# Patient Record
Sex: Female | Born: 1965
Health system: Southern US, Community
[De-identification: ages and names within clinical notes are randomized; demographics above are authoritative.]

## PROBLEM LIST (undated history)

## (undated) HISTORY — PX: APPENDECTOMY: SHX54

---

## 2002-05-06 ENCOUNTER — Other Ambulatory Visit: Admission: RE | Admit: 2002-05-06 | Discharge: 2002-05-06 | Payer: Self-pay | Admitting: Gynecology

## 2003-11-07 ENCOUNTER — Other Ambulatory Visit: Admission: RE | Admit: 2003-11-07 | Discharge: 2003-11-07 | Payer: Self-pay | Admitting: Gynecology

## 2005-04-15 ENCOUNTER — Other Ambulatory Visit: Admission: RE | Admit: 2005-04-15 | Discharge: 2005-04-15 | Payer: Self-pay | Admitting: Gynecology

## 2005-07-24 ENCOUNTER — Emergency Department (HOSPITAL_COMMUNITY): Admission: EM | Admit: 2005-07-24 | Discharge: 2005-07-24 | Payer: Self-pay | Admitting: Emergency Medicine

## 2006-02-22 ENCOUNTER — Inpatient Hospital Stay (HOSPITAL_COMMUNITY): Admission: AD | Admit: 2006-02-22 | Discharge: 2006-02-22 | Payer: Self-pay | Admitting: Gynecology

## 2006-04-24 ENCOUNTER — Inpatient Hospital Stay (HOSPITAL_COMMUNITY): Admission: AD | Admit: 2006-04-24 | Discharge: 2006-04-24 | Payer: Self-pay | Admitting: Gynecology

## 2006-05-08 ENCOUNTER — Inpatient Hospital Stay (HOSPITAL_COMMUNITY): Admission: AD | Admit: 2006-05-08 | Discharge: 2006-05-11 | Payer: Self-pay | Admitting: Gynecology

## 2006-05-09 ENCOUNTER — Encounter (INDEPENDENT_AMBULATORY_CARE_PROVIDER_SITE_OTHER): Payer: Self-pay | Admitting: Specialist

## 2006-06-13 ENCOUNTER — Other Ambulatory Visit: Admission: RE | Admit: 2006-06-13 | Discharge: 2006-06-13 | Payer: Self-pay | Admitting: Gynecology

## 2007-09-23 ENCOUNTER — Inpatient Hospital Stay (HOSPITAL_COMMUNITY): Admission: AD | Admit: 2007-09-23 | Discharge: 2007-09-26 | Payer: Self-pay | Admitting: Obstetrics & Gynecology

## 2007-09-23 ENCOUNTER — Inpatient Hospital Stay (HOSPITAL_COMMUNITY): Admission: AD | Admit: 2007-09-23 | Discharge: 2007-09-23 | Payer: Self-pay | Admitting: Obstetrics and Gynecology

## 2009-10-30 ENCOUNTER — Emergency Department (HOSPITAL_COMMUNITY): Admission: EM | Admit: 2009-10-30 | Discharge: 2009-10-30 | Payer: Self-pay | Admitting: Emergency Medicine

## 2010-12-09 LAB — CBC
HCT: 30.4 — ABNORMAL LOW
HCT: 37.3
MCHC: 34.5
MCV: 95.4
Platelets: 126 — ABNORMAL LOW
RBC: 3.21 — ABNORMAL LOW
RBC: 3.92
WBC: 16.5 — ABNORMAL HIGH

## 2010-12-09 LAB — RPR: RPR Ser Ql: NONREACTIVE

## 2011-09-18 ENCOUNTER — Emergency Department (HOSPITAL_COMMUNITY)
Admission: EM | Admit: 2011-09-18 | Discharge: 2011-09-18 | Disposition: A | Discharge status: Home / Self Care | Payer: BC Managed Care – PPO | Attending: Emergency Medicine | Admitting: Emergency Medicine

## 2011-09-18 ENCOUNTER — Emergency Department (HOSPITAL_COMMUNITY): Payer: BC Managed Care – PPO

## 2011-09-18 ENCOUNTER — Encounter (HOSPITAL_COMMUNITY): Payer: Self-pay | Admitting: Nurse Practitioner

## 2011-09-18 DIAGNOSIS — Y9355 Activity, bike riding: Secondary | ICD-10-CM | POA: Insufficient documentation

## 2011-09-18 DIAGNOSIS — IMO0002 Reserved for concepts with insufficient information to code with codable children: Secondary | ICD-10-CM | POA: Insufficient documentation

## 2011-09-18 DIAGNOSIS — M25529 Pain in unspecified elbow: Secondary | ICD-10-CM | POA: Insufficient documentation

## 2011-09-18 DIAGNOSIS — M25522 Pain in left elbow: Secondary | ICD-10-CM

## 2011-09-18 MED ORDER — OXYCODONE-ACETAMINOPHEN 5-325 MG PO TABS
1.0000 | ORAL_TABLET | Freq: Once | ORAL | Status: AC
  Administered 2011-09-18: 1 via ORAL
  Filled 2011-09-18: qty 1
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
## 2012-03-14 ENCOUNTER — Emergency Department (HOSPITAL_COMMUNITY)
Admission: EM | Admit: 2012-03-14 | Discharge: 2012-03-14 | Disposition: A | Discharge status: Home / Self Care | Payer: BC Managed Care – PPO | Attending: Emergency Medicine | Admitting: Emergency Medicine

## 2012-03-14 ENCOUNTER — Encounter (HOSPITAL_COMMUNITY): Payer: Self-pay | Admitting: *Deleted

## 2012-03-14 DIAGNOSIS — R112 Nausea with vomiting, unspecified: Secondary | ICD-10-CM

## 2012-03-14 DIAGNOSIS — Z3202 Encounter for pregnancy test, result negative: Secondary | ICD-10-CM | POA: Insufficient documentation

## 2012-03-14 LAB — COMPREHENSIVE METABOLIC PANEL
ALT: 13 U/L (ref 0–35)
AST: 25 U/L (ref 0–37)
Albumin: 3.9 g/dL (ref 3.5–5.2)
BUN: 16 mg/dL (ref 6–23)
CO2: 22 mEq/L (ref 19–32)
Chloride: 103 mEq/L (ref 96–112)
Glucose, Bld: 87 mg/dL (ref 70–99)
Potassium: 3.2 mEq/L — ABNORMAL LOW (ref 3.5–5.1)
Total Bilirubin: 0.3 mg/dL (ref 0.3–1.2)

## 2012-03-14 LAB — CBC WITH DIFFERENTIAL/PLATELET
Eosinophils Absolute: 0 10*3/uL (ref 0.0–0.7)
HCT: 39.8 % (ref 36.0–46.0)
Hemoglobin: 13.4 g/dL (ref 12.0–15.0)
Lymphs Abs: 0.6 10*3/uL — ABNORMAL LOW (ref 0.7–4.0)
MCH: 30 pg (ref 26.0–34.0)
MCHC: 33.7 g/dL (ref 30.0–36.0)
MCV: 89 fL (ref 78.0–100.0)
Monocytes Absolute: 0.8 10*3/uL (ref 0.1–1.0)
Monocytes Relative: 16 % — ABNORMAL HIGH (ref 3–12)
Neutrophils Relative %: 71 % (ref 43–77)
RBC: 4.47 MIL/uL (ref 3.87–5.11)

## 2012-03-14 LAB — POCT PREGNANCY, URINE: Preg Test, Ur: NEGATIVE

## 2012-03-14 LAB — URINALYSIS, ROUTINE W REFLEX MICROSCOPIC
Glucose, UA: NEGATIVE mg/dL
Nitrite: NEGATIVE
Specific Gravity, Urine: 1.036 — ABNORMAL HIGH (ref 1.005–1.030)
pH: 5.5 (ref 5.0–8.0)

## 2012-03-14 LAB — URINE MICROSCOPIC-ADD ON

## 2012-03-14 MED ORDER — ONDANSETRON 8 MG PO TBDP: 8.0000 mg | ORAL_TABLET | Freq: Three times a day (TID) | ORAL | Status: AC | PRN

## 2012-03-14 MED ORDER — SODIUM CHLORIDE 0.9 % IV BOLUS (SEPSIS)
1000.0000 mL | Freq: Once | INTRAVENOUS | Status: AC
  Administered 2012-03-14: 1000 mL via INTRAVENOUS

## 2012-03-14 MED ORDER — ONDANSETRON HCL 4 MG/2ML IJ SOLN
4.0000 mg | Freq: Once | INTRAMUSCULAR | Status: AC
  Administered 2012-03-14: 4 mg via INTRAVENOUS
  Filled 2012-03-14: qty 2

## 2012-03-14 NOTE — ED Notes (Signed)
Attempted to collect urine, pt could not void 

## 2012-03-14 NOTE — ED Provider Notes (Signed)
History     CSN: [id number]  Arrival date & time 03/14/12  1038   First MD Initiated Contact with Patient 03/14/12 1057      Chief Complaint  Patient presents with  . Nausea  . Emesis    (Consider location/radiation/quality/duration/timing/severity/associated sxs/prior treatment) HPI Comments: Patient with history of appendectomy as a teenager presents with two-day history of nausea and vomiting. Patient states that at first it was intermittent however vomiting has become more persistent and she is no longer able to keep down any solids or fluids. Last vomiting episode was this morning. Vomiting is nonbloody, nonbilious. Patient denies having any diarrhea. No significant abdominal pain. No fevers, cold symptoms, shortness of breath, chest pain, urinary symptoms. Last menstrual period was 03/05/2012. No treatments prior to arrival. Onset acute. Course is gradually worsening. Nothing makes symptoms better or worse. Patient denies heavy alcohol or NSAID use. No history of stomach ulcers.  Patient is a 47 y.o. female presenting with vomiting. The history is provided by the patient.  Emesis  Pertinent negatives include no abdominal pain, no cough, no diarrhea, no fever, no headaches and no myalgias.    History reviewed. No pertinent past medical history.  History reviewed. No pertinent past surgical history.  History reviewed. No pertinent family history.  History  Substance Use Topics  . Smoking status: Never Smoker   . Smokeless tobacco: Not on file  . Alcohol Use: Yes     Comment: rare    OB History    Grav Para Term Preterm Abortions TAB SAB Ect Mult Living                  Review of Systems  Constitutional: Negative for fever.  HENT: Negative for sore throat and rhinorrhea.   Eyes: Negative for redness.  Respiratory: Negative for cough.   Cardiovascular: Negative for chest pain.  Gastrointestinal: Positive for nausea and vomiting. Negative for abdominal pain and  diarrhea.  Genitourinary: Negative for dysuria.  Musculoskeletal: Negative for myalgias.  Skin: Negative for rash.  Neurological: Negative for headaches.    Allergies  Review of patient's allergies indicates no known allergies.  Home Medications  No current outpatient prescriptions on file.  BP 111/74  Pulse 94  Temp 97.2 F (36.2 C) (Oral)  Resp 20  SpO2 99%  LMP 03/05/2012  Physical Exam  Nursing note and vitals reviewed. Constitutional: She appears well-developed and well-nourished.  HENT:  Head: Normocephalic and atraumatic.  Eyes: Conjunctivae normal are normal. Right eye exhibits no discharge. Left eye exhibits no discharge.  Neck: Normal range of motion. Neck supple.  Cardiovascular: Normal rate, regular rhythm and normal heart sounds.   Pulmonary/Chest: Effort normal and breath sounds normal.  Abdominal: Soft. Bowel sounds are normal. She exhibits no distension. There is tenderness (mild upper abd tenderness on deep palpation). There is no rebound and no guarding.  Neurological: She is alert.  Skin: Skin is warm and dry.  Psychiatric: Her mood appears anxious.       Poor eye contact    ED Course  Procedures (including critical care time)  Labs Reviewed  CBC WITH DIFFERENTIAL - Abnormal; Notable for the following:    Lymphs Abs 0.6 (*)     Monocytes Relative 16 (*)     All other components within normal limits  COMPREHENSIVE METABOLIC PANEL - Abnormal; Notable for the following:    Potassium 3.2 (*)     All other components within normal limits  URINALYSIS, ROUTINE W REFLEX MICROSCOPIC -  Abnormal; Notable for the following:    Color, Urine AMBER (*)  BIOCHEMICALS MAY BE AFFECTED BY COLOR   APPearance CLOUDY (*)     Specific Gravity, Urine 1.036 (*)     Hgb urine dipstick MODERATE (*)     Bilirubin Urine MODERATE (*)     Ketones, ur 40 (*)     Protein, ur 30 (*)     Leukocytes, UA SMALL (*)     All other components within normal limits  URINE  MICROSCOPIC-ADD ON - Abnormal; Notable for the following:    Squamous Epithelial / LPF MANY (*)     All other components within normal limits  POCT PREGNANCY, URINE   No results found.   1. Nausea and vomiting     11:11 AM Patient seen and examined. Work-up initiated. Medications ordered.   Vital signs reviewed and are as follows: Filed Vitals:   03/14/12 1042  BP: 111/74  Pulse: 94  Temp: 97.2 F (36.2 C)  Resp: 20   Patient feeling much better after treatment with fluids and antiemetics. She is tolerating oral fluids without vomiting. Patient informed of lab results. She has received 2 L normal saline.  The patient was urged to return to the Emergency Department immediately with worsening of current symptoms, worsening abdominal pain, persistent vomiting, blood noted in stools, fever, or any other concerns. The patient verbalized understanding.   MDM  Patient with nausea and vomiting for 24 hours. No diarrhea. Minimal abdominal pain on exam or by history. Labs are reassuring. Urine is concentrated. Patient treated with IV fluids. Otherwise she appears well and is stable for discharge home with supportive care. Strict return instructions given.        [place], [place] 03/14/12 [phone]

## 2012-03-14 NOTE — ED Notes (Signed)
Nausea and vomiting since yesterday. No diarrhea. Nobody else in home ill.

## 2012-03-14 NOTE — ED Provider Notes (Signed)
Medical screening examination/treatment/procedure(s) were performed by non-physician practitioner and as supervising physician I was immediately available for consultation/collaboration.    Lyrick Worland L Honestii Marton, MD 03/14/12 2229 

## 2016-01-13 DIAGNOSIS — R5383 Other fatigue: Secondary | ICD-10-CM | POA: Diagnosis not present

## 2016-01-13 DIAGNOSIS — Z23 Encounter for immunization: Secondary | ICD-10-CM | POA: Diagnosis not present

## 2016-01-13 DIAGNOSIS — N951 Menopausal and female climacteric states: Secondary | ICD-10-CM | POA: Diagnosis not present

## 2016-01-13 DIAGNOSIS — R232 Flushing: Secondary | ICD-10-CM | POA: Diagnosis not present

## 2016-02-10 DIAGNOSIS — N951 Menopausal and female climacteric states: Secondary | ICD-10-CM | POA: Diagnosis not present

## 2016-02-10 DIAGNOSIS — F418 Other specified anxiety disorders: Secondary | ICD-10-CM | POA: Diagnosis not present

## 2016-08-02 DIAGNOSIS — F332 Major depressive disorder, recurrent severe without psychotic features: Secondary | ICD-10-CM | POA: Diagnosis not present

## 2016-08-02 DIAGNOSIS — F411 Generalized anxiety disorder: Secondary | ICD-10-CM | POA: Diagnosis not present

## 2016-08-10 DIAGNOSIS — F411 Generalized anxiety disorder: Secondary | ICD-10-CM | POA: Diagnosis not present

## 2016-08-10 DIAGNOSIS — F332 Major depressive disorder, recurrent severe without psychotic features: Secondary | ICD-10-CM | POA: Diagnosis not present

## 2016-08-16 DIAGNOSIS — F411 Generalized anxiety disorder: Secondary | ICD-10-CM | POA: Diagnosis not present

## 2016-08-16 DIAGNOSIS — F332 Major depressive disorder, recurrent severe without psychotic features: Secondary | ICD-10-CM | POA: Diagnosis not present

## 2016-10-18 DIAGNOSIS — F418 Other specified anxiety disorders: Secondary | ICD-10-CM | POA: Diagnosis not present

## 2016-12-09 DIAGNOSIS — F411 Generalized anxiety disorder: Secondary | ICD-10-CM | POA: Diagnosis not present

## 2016-12-09 DIAGNOSIS — F332 Major depressive disorder, recurrent severe without psychotic features: Secondary | ICD-10-CM | POA: Diagnosis not present

## 2016-12-15 DIAGNOSIS — F411 Generalized anxiety disorder: Secondary | ICD-10-CM | POA: Diagnosis not present

## 2016-12-15 DIAGNOSIS — F332 Major depressive disorder, recurrent severe without psychotic features: Secondary | ICD-10-CM | POA: Diagnosis not present

## 2017-02-14 DIAGNOSIS — F411 Generalized anxiety disorder: Secondary | ICD-10-CM | POA: Diagnosis not present

## 2017-02-15 DIAGNOSIS — Z23 Encounter for immunization: Secondary | ICD-10-CM | POA: Diagnosis not present

## 2017-02-15 DIAGNOSIS — F418 Other specified anxiety disorders: Secondary | ICD-10-CM | POA: Diagnosis not present

## 2017-06-20 DIAGNOSIS — F411 Generalized anxiety disorder: Secondary | ICD-10-CM | POA: Diagnosis not present

## 2017-06-20 DIAGNOSIS — F332 Major depressive disorder, recurrent severe without psychotic features: Secondary | ICD-10-CM | POA: Diagnosis not present

## 2017-06-27 DIAGNOSIS — F332 Major depressive disorder, recurrent severe without psychotic features: Secondary | ICD-10-CM | POA: Diagnosis not present

## 2017-06-27 DIAGNOSIS — F411 Generalized anxiety disorder: Secondary | ICD-10-CM | POA: Diagnosis not present

## 2017-07-11 DIAGNOSIS — F332 Major depressive disorder, recurrent severe without psychotic features: Secondary | ICD-10-CM | POA: Diagnosis not present

## 2017-07-11 DIAGNOSIS — F411 Generalized anxiety disorder: Secondary | ICD-10-CM | POA: Diagnosis not present

## 2017-07-18 DIAGNOSIS — F322 Major depressive disorder, single episode, severe without psychotic features: Secondary | ICD-10-CM | POA: Diagnosis not present

## 2017-07-18 DIAGNOSIS — F411 Generalized anxiety disorder: Secondary | ICD-10-CM | POA: Diagnosis not present

## 2017-07-24 DIAGNOSIS — F332 Major depressive disorder, recurrent severe without psychotic features: Secondary | ICD-10-CM | POA: Diagnosis not present

## 2017-07-24 DIAGNOSIS — F411 Generalized anxiety disorder: Secondary | ICD-10-CM | POA: Diagnosis not present

## 2017-07-31 DIAGNOSIS — F4323 Adjustment disorder with mixed anxiety and depressed mood: Secondary | ICD-10-CM | POA: Diagnosis not present

## 2017-08-09 DIAGNOSIS — F331 Major depressive disorder, recurrent, moderate: Secondary | ICD-10-CM | POA: Diagnosis not present

## 2017-08-16 DIAGNOSIS — F4320 Adjustment disorder, unspecified: Secondary | ICD-10-CM | POA: Diagnosis not present

## 2017-08-21 DIAGNOSIS — F331 Major depressive disorder, recurrent, moderate: Secondary | ICD-10-CM | POA: Diagnosis not present

## 2017-08-28 DIAGNOSIS — F331 Major depressive disorder, recurrent, moderate: Secondary | ICD-10-CM | POA: Diagnosis not present

## 2017-09-13 DIAGNOSIS — F331 Major depressive disorder, recurrent, moderate: Secondary | ICD-10-CM | POA: Diagnosis not present

## 2017-09-20 DIAGNOSIS — F331 Major depressive disorder, recurrent, moderate: Secondary | ICD-10-CM | POA: Diagnosis not present

## 2017-09-27 DIAGNOSIS — F331 Major depressive disorder, recurrent, moderate: Secondary | ICD-10-CM | POA: Diagnosis not present

## 2017-10-04 DIAGNOSIS — F331 Major depressive disorder, recurrent, moderate: Secondary | ICD-10-CM | POA: Diagnosis not present

## 2017-10-10 DIAGNOSIS — F331 Major depressive disorder, recurrent, moderate: Secondary | ICD-10-CM | POA: Diagnosis not present

## 2017-10-13 DIAGNOSIS — J029 Acute pharyngitis, unspecified: Secondary | ICD-10-CM | POA: Diagnosis not present

## 2017-10-13 DIAGNOSIS — J0100 Acute maxillary sinusitis, unspecified: Secondary | ICD-10-CM | POA: Diagnosis not present

## 2017-10-18 DIAGNOSIS — F331 Major depressive disorder, recurrent, moderate: Secondary | ICD-10-CM | POA: Diagnosis not present

## 2017-10-25 DIAGNOSIS — F332 Major depressive disorder, recurrent severe without psychotic features: Secondary | ICD-10-CM | POA: Diagnosis not present

## 2017-11-17 DIAGNOSIS — F4310 Post-traumatic stress disorder, unspecified: Secondary | ICD-10-CM | POA: Diagnosis not present

## 2017-11-23 DIAGNOSIS — F4310 Post-traumatic stress disorder, unspecified: Secondary | ICD-10-CM | POA: Diagnosis not present

## 2017-12-04 DIAGNOSIS — F332 Major depressive disorder, recurrent severe without psychotic features: Secondary | ICD-10-CM | POA: Diagnosis not present

## 2017-12-12 DIAGNOSIS — F332 Major depressive disorder, recurrent severe without psychotic features: Secondary | ICD-10-CM | POA: Diagnosis not present

## 2017-12-19 DIAGNOSIS — F332 Major depressive disorder, recurrent severe without psychotic features: Secondary | ICD-10-CM | POA: Diagnosis not present

## 2017-12-27 DIAGNOSIS — F332 Major depressive disorder, recurrent severe without psychotic features: Secondary | ICD-10-CM | POA: Diagnosis not present

## 2018-01-01 DIAGNOSIS — F332 Major depressive disorder, recurrent severe without psychotic features: Secondary | ICD-10-CM | POA: Diagnosis not present

## 2018-01-09 DIAGNOSIS — F332 Major depressive disorder, recurrent severe without psychotic features: Secondary | ICD-10-CM | POA: Diagnosis not present

## 2018-01-17 DIAGNOSIS — F332 Major depressive disorder, recurrent severe without psychotic features: Secondary | ICD-10-CM | POA: Diagnosis not present

## 2018-01-24 DIAGNOSIS — F332 Major depressive disorder, recurrent severe without psychotic features: Secondary | ICD-10-CM | POA: Diagnosis not present

## 2018-01-31 DIAGNOSIS — F332 Major depressive disorder, recurrent severe without psychotic features: Secondary | ICD-10-CM | POA: Diagnosis not present

## 2018-02-06 DIAGNOSIS — F332 Major depressive disorder, recurrent severe without psychotic features: Secondary | ICD-10-CM | POA: Diagnosis not present

## 2018-02-14 DIAGNOSIS — F332 Major depressive disorder, recurrent severe without psychotic features: Secondary | ICD-10-CM | POA: Diagnosis not present

## 2018-02-19 DIAGNOSIS — F332 Major depressive disorder, recurrent severe without psychotic features: Secondary | ICD-10-CM | POA: Diagnosis not present

## 2018-02-27 DIAGNOSIS — F332 Major depressive disorder, recurrent severe without psychotic features: Secondary | ICD-10-CM | POA: Diagnosis not present

## 2018-03-12 DIAGNOSIS — F332 Major depressive disorder, recurrent severe without psychotic features: Secondary | ICD-10-CM | POA: Diagnosis not present

## 2018-03-21 DIAGNOSIS — F332 Major depressive disorder, recurrent severe without psychotic features: Secondary | ICD-10-CM | POA: Diagnosis not present

## 2018-03-27 DIAGNOSIS — F332 Major depressive disorder, recurrent severe without psychotic features: Secondary | ICD-10-CM | POA: Diagnosis not present

## 2018-04-17 DIAGNOSIS — F332 Major depressive disorder, recurrent severe without psychotic features: Secondary | ICD-10-CM | POA: Diagnosis not present

## 2018-04-23 DIAGNOSIS — F332 Major depressive disorder, recurrent severe without psychotic features: Secondary | ICD-10-CM | POA: Diagnosis not present

## 2018-04-30 DIAGNOSIS — F332 Major depressive disorder, recurrent severe without psychotic features: Secondary | ICD-10-CM | POA: Diagnosis not present

## 2018-05-08 DIAGNOSIS — F332 Major depressive disorder, recurrent severe without psychotic features: Secondary | ICD-10-CM | POA: Diagnosis not present

## 2018-05-15 DIAGNOSIS — F332 Major depressive disorder, recurrent severe without psychotic features: Secondary | ICD-10-CM | POA: Diagnosis not present

## 2018-05-22 DIAGNOSIS — F332 Major depressive disorder, recurrent severe without psychotic features: Secondary | ICD-10-CM | POA: Diagnosis not present

## 2018-05-27 ENCOUNTER — Emergency Department (HOSPITAL_COMMUNITY): Payer: BLUE CROSS/BLUE SHIELD

## 2018-05-27 ENCOUNTER — Other Ambulatory Visit: Payer: Self-pay

## 2018-05-27 ENCOUNTER — Encounter (HOSPITAL_COMMUNITY): Payer: Self-pay

## 2018-05-27 ENCOUNTER — Emergency Department (HOSPITAL_COMMUNITY)
Admission: EM | Admit: 2018-05-27 | Discharge: 2018-05-27 | Disposition: A | Discharge status: Home / Self Care | Payer: BLUE CROSS/BLUE SHIELD | Attending: Emergency Medicine | Admitting: Emergency Medicine

## 2018-05-27 DIAGNOSIS — S61431A Puncture wound without foreign body of right hand, initial encounter: Secondary | ICD-10-CM | POA: Diagnosis not present

## 2018-05-27 DIAGNOSIS — Y999 Unspecified external cause status: Secondary | ICD-10-CM | POA: Insufficient documentation

## 2018-05-27 DIAGNOSIS — Y9389 Activity, other specified: Secondary | ICD-10-CM | POA: Insufficient documentation

## 2018-05-27 DIAGNOSIS — W5501XA Bitten by cat, initial encounter: Secondary | ICD-10-CM | POA: Insufficient documentation

## 2018-05-27 DIAGNOSIS — Z23 Encounter for immunization: Secondary | ICD-10-CM | POA: Insufficient documentation

## 2018-05-27 DIAGNOSIS — Y92009 Unspecified place in unspecified non-institutional (private) residence as the place of occurrence of the external cause: Secondary | ICD-10-CM | POA: Insufficient documentation

## 2018-05-27 DIAGNOSIS — S61452A Open bite of left hand, initial encounter: Secondary | ICD-10-CM | POA: Diagnosis not present

## 2018-05-27 DIAGNOSIS — S61032A Puncture wound without foreign body of left thumb without damage to nail, initial encounter: Secondary | ICD-10-CM | POA: Diagnosis not present

## 2018-05-27 DIAGNOSIS — S61451A Open bite of right hand, initial encounter: Secondary | ICD-10-CM

## 2018-05-27 LAB — CBC WITH DIFFERENTIAL/PLATELET
ABS IMMATURE GRANULOCYTES: 0.03 10*3/uL (ref 0.00–0.07)
BASOS ABS: 0.1 10*3/uL (ref 0.0–0.1)
BASOS PCT: 1 %
Eosinophils Absolute: 0.3 10*3/uL (ref 0.0–0.5)
Eosinophils Relative: 4 %
HCT: 39.3 % (ref 36.0–46.0)
HEMOGLOBIN: 13 g/dL (ref 12.0–15.0)
Immature Granulocytes: 0 %
LYMPHS PCT: 28 %
Lymphs Abs: 2 10*3/uL (ref 0.7–4.0)
MCH: 30 pg (ref 26.0–34.0)
MCHC: 33.1 g/dL (ref 30.0–36.0)
MCV: 90.6 fL (ref 80.0–100.0)
MONO ABS: 0.8 10*3/uL (ref 0.1–1.0)
Monocytes Relative: 11 %
NEUTROS ABS: 4 10*3/uL (ref 1.7–7.7)
NEUTROS PCT: 56 %
NRBC: 0 % (ref 0.0–0.2)
PLATELETS: 218 10*3/uL (ref 150–400)
RBC: 4.34 MIL/uL (ref 3.87–5.11)
RDW: 12.3 % (ref 11.5–15.5)
WBC: 7.2 10*3/uL (ref 4.0–10.5)

## 2018-05-27 LAB — BASIC METABOLIC PANEL
ANION GAP: 2 — AB (ref 5–15)
BUN: 15 mg/dL (ref 6–20)
CHLORIDE: 107 mmol/L (ref 98–111)
CO2: 24 mmol/L (ref 22–32)
Calcium: 8.6 mg/dL — ABNORMAL LOW (ref 8.9–10.3)
Creatinine, Ser: 0.62 mg/dL (ref 0.44–1.00)
Glucose, Bld: 94 mg/dL (ref 70–99)
POTASSIUM: 3.8 mmol/L (ref 3.5–5.1)
SODIUM: 133 mmol/L — AB (ref 135–145)

## 2018-05-27 LAB — I-STAT BETA HCG BLOOD, ED (MC, WL, AP ONLY)

## 2018-05-27 MED ORDER — SODIUM CHLORIDE 0.9 % IV SOLN
3.0000 g | Freq: Once | INTRAVENOUS | Status: AC
  Administered 2018-05-27: 3 g via INTRAVENOUS
  Filled 2018-05-27 (×2): qty 3

## 2018-05-27 MED ORDER — AMOXICILLIN-POT CLAVULANATE 875-125 MG PO TABS: 1.0000 | ORAL_TABLET | Freq: Two times a day (BID) | ORAL | 0 refills | Status: AC

## 2018-05-27 MED ORDER — LIDOCAINE-EPINEPHRINE-TETRACAINE (LET) SOLUTION
3.0000 mL | Freq: Once | NASAL | Status: AC
  Administered 2018-05-27: 3 mL via TOPICAL
  Filled 2018-05-27: qty 3

## 2018-05-27 MED ORDER — TETANUS-DIPHTH-ACELL PERTUSSIS 5-2.5-18.5 LF-MCG/0.5 IM SUSP
0.5000 mL | Freq: Once | INTRAMUSCULAR | Status: AC
  Administered 2018-05-27: 0.5 mL via INTRAMUSCULAR
  Filled 2018-05-27: qty 0.5

## 2018-05-27 MED ORDER — SODIUM CHLORIDE 0.9 % IV SOLN
INTRAVENOUS | Status: DC | PRN
  Administered 2018-05-27: 40 mL via INTRAVENOUS

## 2018-05-27 NOTE — ED Triage Notes (Signed)
Onset 1 hour PTA pts own cat bit her on right and left hand.  Cat's vaccines UTD.  Unknown last tetanus shot.

## 2018-05-27 NOTE — Discharge Instructions (Addendum)
Please see the information and instructions below regarding your visit.  Your diagnoses today include:  1. Cat bite of right hand, initial encounter   2. Cat bite of left hand, initial encounter     Tests performed today include: See side panel of your discharge paperwork for testing performed today. Vital signs are listed at the bottom of these instructions.   Medications prescribed:    Take any prescribed medications only as prescribed, and any over the counter medications only as directed on the packaging.  1. Please take all of your antibiotics until finished.   You may develop abdominal discomfort or nausea from the antibiotic. If this occurs, you may take it with food. Some patients also get diarrhea with antibiotics. You may help offset this with probiotics which you can buy or get in yogurt. Do not eat or take the probiotics until 2 hours after your antibiotic. Some women develop vaginal yeast infections after antibiotics. If you develop unusual vaginal discharge after being on this medication, please see your primary care provider.   Some people develop allergies to antibiotics. Symptoms of antibiotic allergy can be mild and include a flat rash and itching. They can also be more serious and include:  ?Hives - Hives are raised, red patches of skin that are usually very itchy.  ?Lip or tongue swelling  ?Trouble swallowing or breathing  ?Blistering of the skin or mouth.  If you have any of these serious symptoms, please seek emergency medical care immediately.  Home care instructions:  Please follow any educational materials contained in this packet.    Keep affected area above the level of your heart when possible. Wash area gently twice a day with warm soapy water. Do not apply alcohol or hydrogen peroxide. Cover the area if it draining or weeping.   Please apply the dressing as we showed you today and replace the splint.  Follow-up instructions: Please follow-up with your  primary care provider or the ED in 48 hours for a check of the infection if symptoms are not improving.   Return instructions:  Please return to the Emergency Department if you experience worsening symptoms. Call your doctor sooner or return to the ER if you develop worsening signs of infection such as: increased redness, increased pain, pus, fever, or other symptoms that concern you. Please monitor the area we marked with a pen today. Please return if you have any other emergent concerns.  Additional Information:   Your vital signs today were: BP 126/81    Pulse 73    Temp 97.9 F (36.6 C) (Oral)    Resp 16    SpO2 97%  If your blood pressure (BP) was elevated on multiple readings during this visit above 130 for the top number or above 80 for the bottom number, please have this repeated by your primary care provider within one month. --------------  Thank you for allowing [place] to participate in your care today. It was a pleasure taking care of you today!

## 2018-05-27 NOTE — Progress Notes (Signed)
Discussed with EDP.  Acute cat bite with soft tissue swelling.  Reviewed clinical images.  Looks ok for dose of unasyn and po augment and followup with me tomorrow morning.   Eulas Post, MD

## 2018-05-27 NOTE — ED Provider Notes (Signed)
[facility] EMERGENCY DEPARTMENT Provider Note   CSN: [id number] Arrival date & time: 05/27/18  1121    History   Chief Complaint Chief Complaint  Patient presents with  . Animal Bite    HPI Olivia Nash is a 53 y.o. female.     HPI  Patient is a 53 year old female with no significant past medical history presenting for cat bite to the right left hand.  Patient reports that she went outside to bring her cat back into the house this morning approximately 1 hour ago when the cat sunk its teeth into her right snuffbox and left thumb.  Patient reports that her cat is fully up-to-date on all rabies vaccinations.  Patient reports that subsequently the bite in the right hand is burning and she does have some irritation of the arm.  She does report that it has been progressively swelling over the hours since she was bitten by the cat.  Patient denies any numbness, tingling, or weakness distally in any fingers of the right or left hand.  Unknown last tetanus shot.  History reviewed. No pertinent past medical history.  There are no active problems to display for this patient.   Past Surgical History:  Procedure Laterality Date  . APPENDECTOMY       OB History   No obstetric history on file.      Home Medications    Prior to Admission medications   Medication Sig Start Date End Date Taking? Authorizing Provider  ondansetron (ZOFRAN ODT) 8 MG disintegrating tablet Take 1 tablet (8 mg total) by mouth every 8 (eight) hours as needed for nausea. 03/14/12   Renne Crigler, PA-C    Family History History reviewed. No pertinent family history.  Social History Social History   Tobacco Use  . Smoking status: Never Smoker  Substance Use Topics  . Alcohol use: Yes    Comment: rare  . Drug use: No     Allergies   Patient has no known allergies.   Review of Systems Review of Systems  Constitutional: Negative for chills and fever.  Gastrointestinal:  Negative for nausea and vomiting.  Musculoskeletal: Positive for arthralgias.  Skin: Positive for color change and wound.  Neurological: Negative for weakness and numbness.  All other systems reviewed and are negative.    Physical Exam Updated Vital Signs BP 126/81   Pulse 73   Temp 97.9 F (36.6 C) (Oral)   Resp 16   SpO2 97%   Physical Exam Vitals signs and nursing note reviewed.  Constitutional:      General: She is not in acute distress.    Appearance: She is well-developed. She is not diaphoretic.     Comments: Sitting comfortably in bed.  HENT:     Head: Normocephalic and atraumatic.  Eyes:     General:        Right eye: No discharge.        Left eye: No discharge.     Conjunctiva/sclera: Conjunctivae normal.     Comments: EOMs normal to gross examination.  Neck:     Musculoskeletal: Normal range of motion.  Cardiovascular:     Rate and Rhythm: Normal rate and regular rhythm.     Comments: Intact, 2+ radial pulse bilaterally. Pulmonary:     Effort: Pulmonary effort is normal.     Breath sounds: Normal breath sounds. No wheezing or rales.  Abdominal:     General: There is no distension.  Musculoskeletal:  General: Swelling and tenderness present.     Comments: See clinical photo for details. RUE exam: Patient has 2 punctate wounds of the snuffbox of the right hand.  There is surrounding erythema.  Bleeding controlled.  No streaking up the right arm.  Patient does have 2 linear abrasions of the antecubital fossa on right. Left Hand: Punctate wound over the left thumb. Minimal surrounding erythema.   Skin:    General: Skin is warm and dry.  Neurological:     Mental Status: She is alert.     Comments: Cranial nerves intact to gross observation. Patient moves extremities without difficulty.  Psychiatric:        Behavior: Behavior normal.        Thought Content: Thought content normal.        Judgment: Judgment normal.            ED Treatments /  Results  Labs (all labs ordered are listed, but only abnormal results are displayed) Labs Reviewed  BASIC METABOLIC PANEL  CBC WITH DIFFERENTIAL/PLATELET  I-STAT BETA HCG BLOOD, ED ([facility], [facility], [facility] ONLY)    EKG None  Radiology No results found.  Procedures Procedures (including critical care time)  Medications Ordered in ED Medications    Tdap (BOOSTRIX) injection 0.5 mL (has no administration in time range)  Ampicillin-Sulbactam (UNASYN) 3 g in sodium chloride 0.9 % 100 mL IVPB (has no administration in time range)     Initial Impression / Assessment and Plan / ED Course  I have reviewed the triage vital signs and the nursing notes.  Pertinent labs & imaging results that were available during my care of the patient were reviewed by me and considered in my medical decision making (see chart for details).  Clinical Course as of May 26 1517  Sun May 27, 2018  1442 Spoke with Dr. Dion Saucier who reviewed clinical images.  He states that he does not feel patient needs to be admitted for observation IV antibiotics at this time.  Feels that the swelling likely secondary to trauma.  Recommends Augmentin and follow-up with him tomorrow between 830 and 9:30 in the morning.  Recommends Xeroform dressing and palmar splint.  Appreciate his involvement in the care of this patient.   [AM]    Clinical Course User Index [AM] Elisha Ponder, PA-C       Patient nontoxic-appearing, afebrile, and hemodynamically stable.  Patient status post puncture wounds from cat bite to the right and left hand approximately 1 hour ago.  The bites to the right snuffbox already exhibiting significant erythema.  Will initiate IV ampicillin, update tetanus, and flush wounds copiously with pressure.  Animals rabies vaccinations are up-to-date, no need to initiate rabies vaccinations currently.  Will mark area and observe closely, however given the amount of erythema, considering admission.  Lab work without acute  abnormality.  No evidence of retained foreign body and based on x-ray.  Patient received 3 g Unasyn, will have Augmentin at home.  Final Clinical Impressions(s) / ED Diagnoses   Final diagnoses:  Cat bite of right hand, initial encounter  Cat bite of left hand, initial encounter    ED Discharge Orders         Ordered    amoxicillin-clavulanate (AUGMENTIN) 875-125 MG tablet  Every 12 hours     05/27/18 1521           Delia Chimes 05/27/18 1522    Loren Racer, MD 05/27/18 1555

## 2018-05-30 DIAGNOSIS — F332 Major depressive disorder, recurrent severe without psychotic features: Secondary | ICD-10-CM | POA: Diagnosis not present

## 2018-09-10 DIAGNOSIS — F332 Major depressive disorder, recurrent severe without psychotic features: Secondary | ICD-10-CM | POA: Diagnosis not present

## 2018-09-18 DIAGNOSIS — F332 Major depressive disorder, recurrent severe without psychotic features: Secondary | ICD-10-CM | POA: Diagnosis not present

## 2018-09-28 DIAGNOSIS — F332 Major depressive disorder, recurrent severe without psychotic features: Secondary | ICD-10-CM | POA: Diagnosis not present

## 2018-10-08 DIAGNOSIS — F332 Major depressive disorder, recurrent severe without psychotic features: Secondary | ICD-10-CM | POA: Diagnosis not present

## 2018-10-19 DIAGNOSIS — F332 Major depressive disorder, recurrent severe without psychotic features: Secondary | ICD-10-CM | POA: Diagnosis not present

## 2018-11-26 DIAGNOSIS — F332 Major depressive disorder, recurrent severe without psychotic features: Secondary | ICD-10-CM | POA: Diagnosis not present

## 2018-12-12 DIAGNOSIS — F603 Borderline personality disorder: Secondary | ICD-10-CM | POA: Diagnosis not present

## 2018-12-12 DIAGNOSIS — F319 Bipolar disorder, unspecified: Secondary | ICD-10-CM | POA: Diagnosis not present

## 2019-02-19 DIAGNOSIS — J069 Acute upper respiratory infection, unspecified: Secondary | ICD-10-CM | POA: Diagnosis not present

## 2019-02-20 DIAGNOSIS — R05 Cough: Secondary | ICD-10-CM | POA: Diagnosis not present

## 2019-10-02 IMAGING — CR LEFT HAND - COMPLETE 3+ VIEW
3 series · 3 of 3 positions shown · non-contrast
Comparison: None.

CLINICAL DATA: Cat bites.

EXAM:
LEFT HAND - COMPLETE 3+ VIEW

[hand pa]
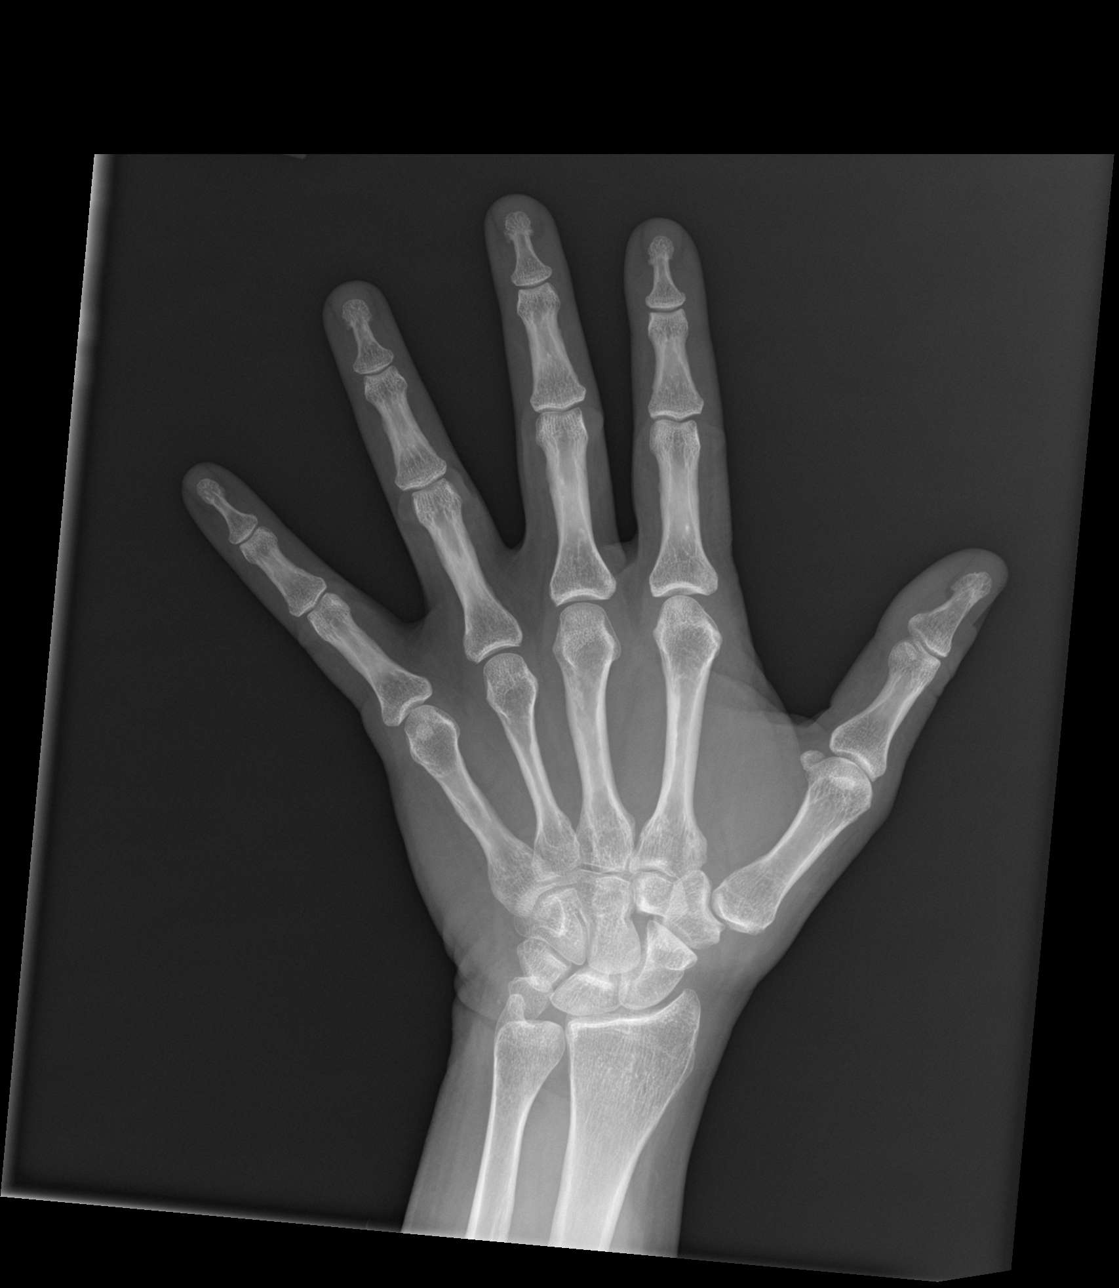

[hand obl]
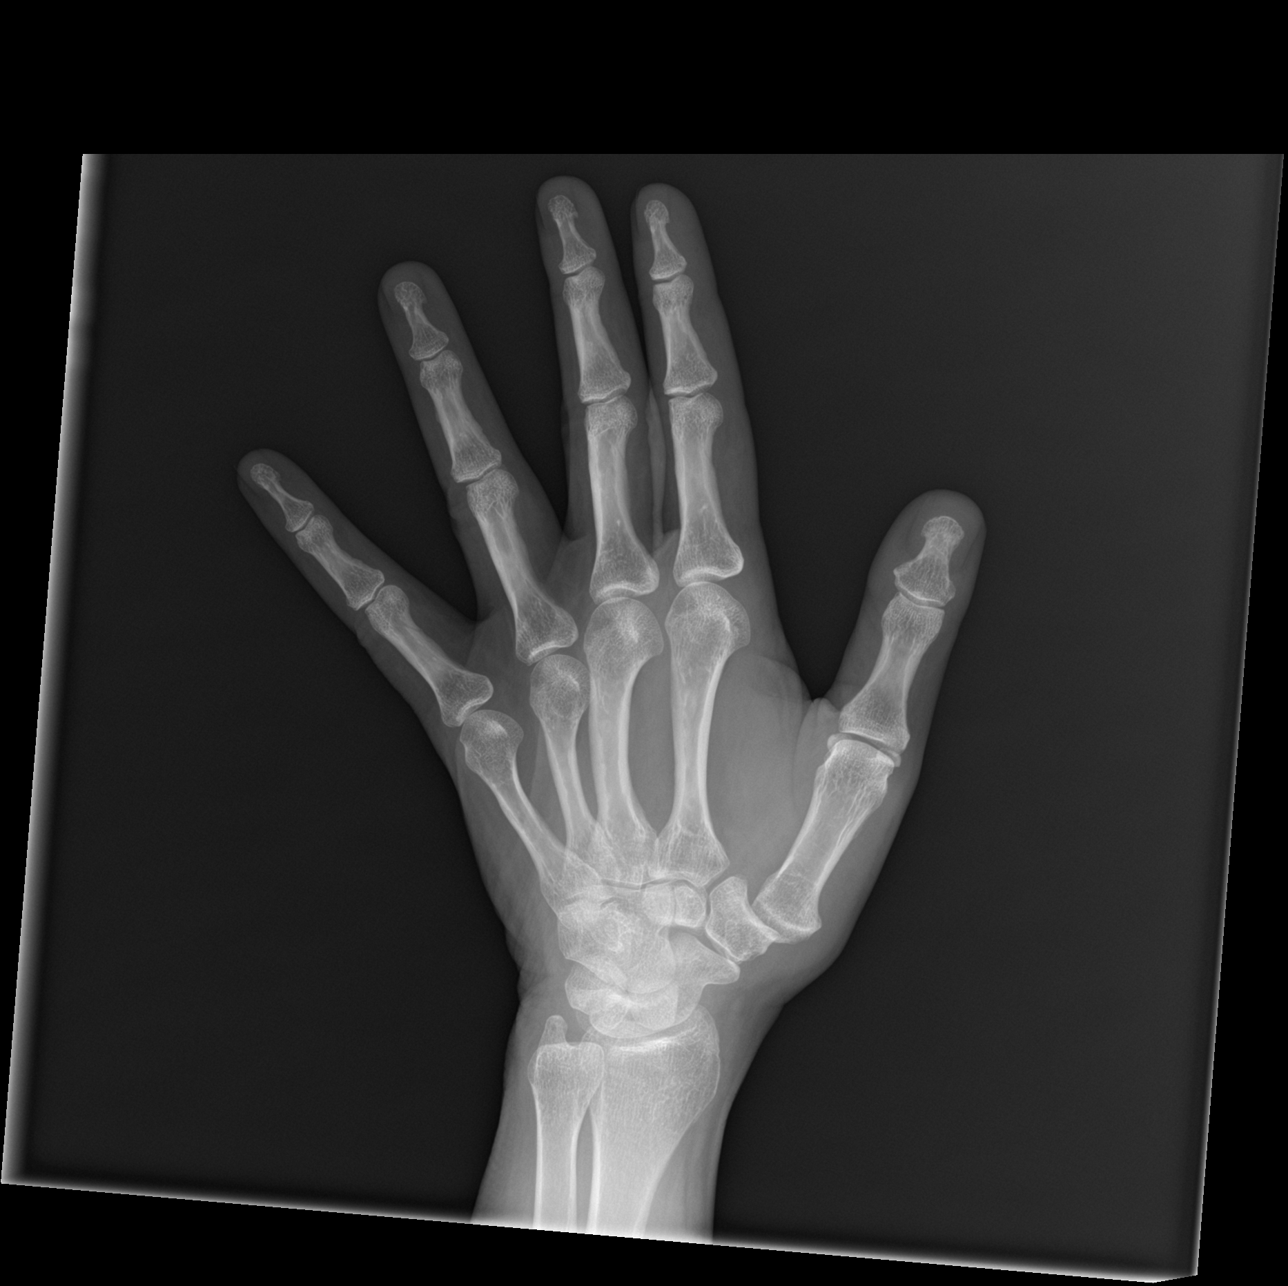

[hand lat]
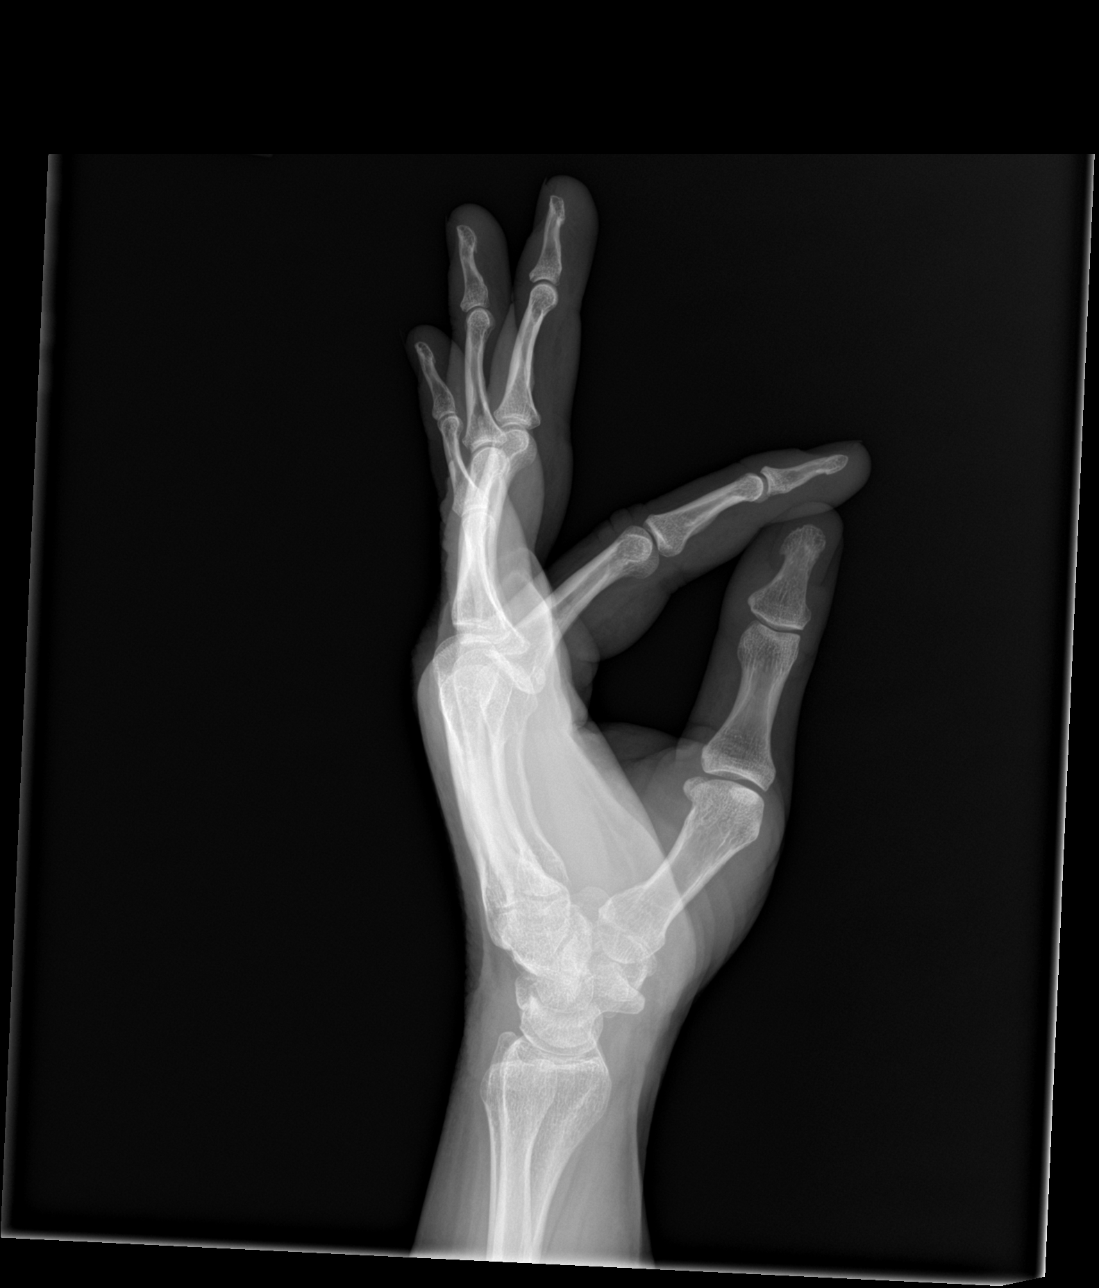

[3 of 3 positions shown; findings below may reference images not displayed]

FINDINGS: The joint spaces are maintained. No acute bony findings or
radiopaque foreign bodies.
IMPRESSION: Normal left hand radiographs.

## 2020-01-05 DIAGNOSIS — Z20822 Contact with and (suspected) exposure to covid-19: Secondary | ICD-10-CM | POA: Diagnosis not present

## 2020-01-05 DIAGNOSIS — Z7289 Other problems related to lifestyle: Secondary | ICD-10-CM | POA: Diagnosis not present

## 2020-01-05 DIAGNOSIS — T43292A Poisoning by other antidepressants, intentional self-harm, initial encounter: Secondary | ICD-10-CM | POA: Diagnosis not present

## 2020-01-05 DIAGNOSIS — F1010 Alcohol abuse, uncomplicated: Secondary | ICD-10-CM | POA: Diagnosis not present

## 2020-01-05 DIAGNOSIS — T1491XA Suicide attempt, initial encounter: Secondary | ICD-10-CM | POA: Diagnosis not present

## 2020-01-05 DIAGNOSIS — F32A Depression, unspecified: Secondary | ICD-10-CM | POA: Diagnosis not present

## 2020-01-05 DIAGNOSIS — R45851 Suicidal ideations: Secondary | ICD-10-CM | POA: Diagnosis not present

## 2020-01-05 DIAGNOSIS — T43222A Poisoning by selective serotonin reuptake inhibitors, intentional self-harm, initial encounter: Secondary | ICD-10-CM | POA: Diagnosis not present

## 2020-01-05 DIAGNOSIS — Z03818 Encounter for observation for suspected exposure to other biological agents ruled out: Secondary | ICD-10-CM | POA: Diagnosis not present

## 2020-01-06 DIAGNOSIS — F332 Major depressive disorder, recurrent severe without psychotic features: Secondary | ICD-10-CM | POA: Diagnosis not present

## 2020-03-18 DIAGNOSIS — F332 Major depressive disorder, recurrent severe without psychotic features: Secondary | ICD-10-CM | POA: Diagnosis not present

## 2020-03-18 DIAGNOSIS — Z62819 Personal history of unspecified abuse in childhood: Secondary | ICD-10-CM | POA: Diagnosis not present

## 2020-04-28 DIAGNOSIS — F411 Generalized anxiety disorder: Secondary | ICD-10-CM | POA: Diagnosis not present

## 2020-04-28 DIAGNOSIS — F332 Major depressive disorder, recurrent severe without psychotic features: Secondary | ICD-10-CM | POA: Diagnosis not present

## 2020-05-25 DIAGNOSIS — F411 Generalized anxiety disorder: Secondary | ICD-10-CM | POA: Diagnosis not present

## 2020-05-25 DIAGNOSIS — F332 Major depressive disorder, recurrent severe without psychotic features: Secondary | ICD-10-CM | POA: Diagnosis not present

## 2020-06-08 DIAGNOSIS — F332 Major depressive disorder, recurrent severe without psychotic features: Secondary | ICD-10-CM | POA: Diagnosis not present

## 2020-06-08 DIAGNOSIS — F411 Generalized anxiety disorder: Secondary | ICD-10-CM | POA: Diagnosis not present

## 2020-08-17 DIAGNOSIS — Z13220 Encounter for screening for lipoid disorders: Secondary | ICD-10-CM | POA: Diagnosis not present

## 2020-08-17 DIAGNOSIS — Z5181 Encounter for therapeutic drug level monitoring: Secondary | ICD-10-CM | POA: Diagnosis not present

## 2020-08-17 DIAGNOSIS — Z79899 Other long term (current) drug therapy: Secondary | ICD-10-CM | POA: Diagnosis not present

## 2020-08-21 DIAGNOSIS — Z0000 Encounter for general adult medical examination without abnormal findings: Secondary | ICD-10-CM | POA: Diagnosis not present

## 2020-08-21 DIAGNOSIS — Z5181 Encounter for therapeutic drug level monitoring: Secondary | ICD-10-CM | POA: Diagnosis not present

## 2020-08-21 DIAGNOSIS — Z13220 Encounter for screening for lipoid disorders: Secondary | ICD-10-CM | POA: Diagnosis not present

## 2021-04-27 DIAGNOSIS — F411 Generalized anxiety disorder: Secondary | ICD-10-CM | POA: Diagnosis not present

## 2021-04-27 DIAGNOSIS — F4321 Adjustment disorder with depressed mood: Secondary | ICD-10-CM | POA: Diagnosis not present

## 2021-08-23 DIAGNOSIS — F4310 Post-traumatic stress disorder, unspecified: Secondary | ICD-10-CM | POA: Diagnosis not present

## 2021-08-23 DIAGNOSIS — F411 Generalized anxiety disorder: Secondary | ICD-10-CM | POA: Diagnosis not present

## 2021-08-23 DIAGNOSIS — F341 Dysthymic disorder: Secondary | ICD-10-CM | POA: Diagnosis not present

## 2021-08-30 DIAGNOSIS — F341 Dysthymic disorder: Secondary | ICD-10-CM | POA: Diagnosis not present

## 2021-08-30 DIAGNOSIS — F411 Generalized anxiety disorder: Secondary | ICD-10-CM | POA: Diagnosis not present

## 2021-08-30 DIAGNOSIS — F4310 Post-traumatic stress disorder, unspecified: Secondary | ICD-10-CM | POA: Diagnosis not present

## 2021-09-06 DIAGNOSIS — F4310 Post-traumatic stress disorder, unspecified: Secondary | ICD-10-CM | POA: Diagnosis not present

## 2021-09-06 DIAGNOSIS — F411 Generalized anxiety disorder: Secondary | ICD-10-CM | POA: Diagnosis not present

## 2021-09-06 DIAGNOSIS — F341 Dysthymic disorder: Secondary | ICD-10-CM | POA: Diagnosis not present

## 2021-09-20 DIAGNOSIS — F4310 Post-traumatic stress disorder, unspecified: Secondary | ICD-10-CM | POA: Diagnosis not present

## 2021-09-20 DIAGNOSIS — F411 Generalized anxiety disorder: Secondary | ICD-10-CM | POA: Diagnosis not present

## 2021-09-20 DIAGNOSIS — F341 Dysthymic disorder: Secondary | ICD-10-CM | POA: Diagnosis not present

## 2021-10-04 DIAGNOSIS — F411 Generalized anxiety disorder: Secondary | ICD-10-CM | POA: Diagnosis not present

## 2021-10-04 DIAGNOSIS — F4310 Post-traumatic stress disorder, unspecified: Secondary | ICD-10-CM | POA: Diagnosis not present

## 2021-10-04 DIAGNOSIS — F341 Dysthymic disorder: Secondary | ICD-10-CM | POA: Diagnosis not present

## 2021-10-05 ENCOUNTER — Other Ambulatory Visit: Payer: Self-pay | Admitting: Family Medicine

## 2021-10-05 DIAGNOSIS — Z124 Encounter for screening for malignant neoplasm of cervix: Secondary | ICD-10-CM | POA: Diagnosis not present

## 2021-10-05 DIAGNOSIS — Z0000 Encounter for general adult medical examination without abnormal findings: Secondary | ICD-10-CM | POA: Diagnosis not present

## 2021-10-11 DIAGNOSIS — F418 Other specified anxiety disorders: Secondary | ICD-10-CM | POA: Diagnosis not present

## 2021-10-11 DIAGNOSIS — F341 Dysthymic disorder: Secondary | ICD-10-CM | POA: Diagnosis not present

## 2021-10-11 DIAGNOSIS — F411 Generalized anxiety disorder: Secondary | ICD-10-CM | POA: Diagnosis not present

## 2021-10-11 DIAGNOSIS — Z0000 Encounter for general adult medical examination without abnormal findings: Secondary | ICD-10-CM | POA: Diagnosis not present

## 2021-10-11 DIAGNOSIS — F4310 Post-traumatic stress disorder, unspecified: Secondary | ICD-10-CM | POA: Diagnosis not present

## 2021-10-11 DIAGNOSIS — Z13220 Encounter for screening for lipoid disorders: Secondary | ICD-10-CM | POA: Diagnosis not present

## 2021-10-18 DIAGNOSIS — F4310 Post-traumatic stress disorder, unspecified: Secondary | ICD-10-CM | POA: Diagnosis not present

## 2021-10-18 DIAGNOSIS — F411 Generalized anxiety disorder: Secondary | ICD-10-CM | POA: Diagnosis not present

## 2021-10-18 DIAGNOSIS — F341 Dysthymic disorder: Secondary | ICD-10-CM | POA: Diagnosis not present

## 2021-10-20 LAB — CYTOLOGY - PAP
Adequacy: ABNORMAL
Adequacy: ABNORMAL
Comment: NEGATIVE
Comment: NEGATIVE
High risk HPV: NEGATIVE

## 2021-11-01 DIAGNOSIS — F341 Dysthymic disorder: Secondary | ICD-10-CM | POA: Diagnosis not present

## 2021-11-01 DIAGNOSIS — F4310 Post-traumatic stress disorder, unspecified: Secondary | ICD-10-CM | POA: Diagnosis not present

## 2021-11-01 DIAGNOSIS — F411 Generalized anxiety disorder: Secondary | ICD-10-CM | POA: Diagnosis not present

## 2021-11-08 DIAGNOSIS — F4310 Post-traumatic stress disorder, unspecified: Secondary | ICD-10-CM | POA: Diagnosis not present

## 2021-11-08 DIAGNOSIS — F411 Generalized anxiety disorder: Secondary | ICD-10-CM | POA: Diagnosis not present

## 2021-11-08 DIAGNOSIS — F341 Dysthymic disorder: Secondary | ICD-10-CM | POA: Diagnosis not present

## 2021-11-15 DIAGNOSIS — F411 Generalized anxiety disorder: Secondary | ICD-10-CM | POA: Diagnosis not present

## 2021-11-15 DIAGNOSIS — F4310 Post-traumatic stress disorder, unspecified: Secondary | ICD-10-CM | POA: Diagnosis not present

## 2021-11-15 DIAGNOSIS — F341 Dysthymic disorder: Secondary | ICD-10-CM | POA: Diagnosis not present

## 2021-12-03 DIAGNOSIS — F341 Dysthymic disorder: Secondary | ICD-10-CM | POA: Diagnosis not present

## 2021-12-03 DIAGNOSIS — F4310 Post-traumatic stress disorder, unspecified: Secondary | ICD-10-CM | POA: Diagnosis not present

## 2021-12-03 DIAGNOSIS — F411 Generalized anxiety disorder: Secondary | ICD-10-CM | POA: Diagnosis not present

## 2021-12-06 DIAGNOSIS — F411 Generalized anxiety disorder: Secondary | ICD-10-CM | POA: Diagnosis not present

## 2021-12-06 DIAGNOSIS — F4310 Post-traumatic stress disorder, unspecified: Secondary | ICD-10-CM | POA: Diagnosis not present

## 2021-12-06 DIAGNOSIS — F341 Dysthymic disorder: Secondary | ICD-10-CM | POA: Diagnosis not present

## 2021-12-17 DIAGNOSIS — F411 Generalized anxiety disorder: Secondary | ICD-10-CM | POA: Diagnosis not present

## 2021-12-17 DIAGNOSIS — F341 Dysthymic disorder: Secondary | ICD-10-CM | POA: Diagnosis not present

## 2021-12-17 DIAGNOSIS — F4310 Post-traumatic stress disorder, unspecified: Secondary | ICD-10-CM | POA: Diagnosis not present

## 2021-12-27 DIAGNOSIS — F411 Generalized anxiety disorder: Secondary | ICD-10-CM | POA: Diagnosis not present

## 2021-12-27 DIAGNOSIS — F341 Dysthymic disorder: Secondary | ICD-10-CM | POA: Diagnosis not present

## 2021-12-27 DIAGNOSIS — F4310 Post-traumatic stress disorder, unspecified: Secondary | ICD-10-CM | POA: Diagnosis not present

## 2022-01-03 DIAGNOSIS — F341 Dysthymic disorder: Secondary | ICD-10-CM | POA: Diagnosis not present

## 2022-01-03 DIAGNOSIS — F4310 Post-traumatic stress disorder, unspecified: Secondary | ICD-10-CM | POA: Diagnosis not present

## 2022-01-03 DIAGNOSIS — F411 Generalized anxiety disorder: Secondary | ICD-10-CM | POA: Diagnosis not present

## 2022-05-17 DIAGNOSIS — F4310 Post-traumatic stress disorder, unspecified: Secondary | ICD-10-CM | POA: Diagnosis not present

## 2022-05-26 DIAGNOSIS — F4310 Post-traumatic stress disorder, unspecified: Secondary | ICD-10-CM | POA: Diagnosis not present

## 2022-05-31 DIAGNOSIS — F4310 Post-traumatic stress disorder, unspecified: Secondary | ICD-10-CM | POA: Diagnosis not present

## 2022-06-07 DIAGNOSIS — F4310 Post-traumatic stress disorder, unspecified: Secondary | ICD-10-CM | POA: Diagnosis not present

## 2022-06-14 DIAGNOSIS — F4310 Post-traumatic stress disorder, unspecified: Secondary | ICD-10-CM | POA: Diagnosis not present

## 2022-06-21 DIAGNOSIS — F4310 Post-traumatic stress disorder, unspecified: Secondary | ICD-10-CM | POA: Diagnosis not present

## 2022-06-28 DIAGNOSIS — F4310 Post-traumatic stress disorder, unspecified: Secondary | ICD-10-CM | POA: Diagnosis not present

## 2022-07-05 DIAGNOSIS — F4310 Post-traumatic stress disorder, unspecified: Secondary | ICD-10-CM | POA: Diagnosis not present

## 2022-07-12 DIAGNOSIS — F4310 Post-traumatic stress disorder, unspecified: Secondary | ICD-10-CM | POA: Diagnosis not present

## 2022-07-19 DIAGNOSIS — F4310 Post-traumatic stress disorder, unspecified: Secondary | ICD-10-CM | POA: Diagnosis not present

## 2022-07-26 DIAGNOSIS — F4310 Post-traumatic stress disorder, unspecified: Secondary | ICD-10-CM | POA: Diagnosis not present

## 2022-08-02 DIAGNOSIS — F4310 Post-traumatic stress disorder, unspecified: Secondary | ICD-10-CM | POA: Diagnosis not present

## 2022-08-09 DIAGNOSIS — F4310 Post-traumatic stress disorder, unspecified: Secondary | ICD-10-CM | POA: Diagnosis not present

## 2022-08-16 DIAGNOSIS — F4310 Post-traumatic stress disorder, unspecified: Secondary | ICD-10-CM | POA: Diagnosis not present

## 2022-08-30 DIAGNOSIS — F4310 Post-traumatic stress disorder, unspecified: Secondary | ICD-10-CM | POA: Diagnosis not present

## 2022-09-06 DIAGNOSIS — F4310 Post-traumatic stress disorder, unspecified: Secondary | ICD-10-CM | POA: Diagnosis not present

## 2022-09-13 DIAGNOSIS — F4310 Post-traumatic stress disorder, unspecified: Secondary | ICD-10-CM | POA: Diagnosis not present

## 2022-09-27 DIAGNOSIS — F4310 Post-traumatic stress disorder, unspecified: Secondary | ICD-10-CM | POA: Diagnosis not present

## 2022-10-05 DIAGNOSIS — F4310 Post-traumatic stress disorder, unspecified: Secondary | ICD-10-CM | POA: Diagnosis not present

## 2022-10-18 DIAGNOSIS — F4310 Post-traumatic stress disorder, unspecified: Secondary | ICD-10-CM | POA: Diagnosis not present

## 2022-10-25 DIAGNOSIS — F4310 Post-traumatic stress disorder, unspecified: Secondary | ICD-10-CM | POA: Diagnosis not present

## 2022-11-01 DIAGNOSIS — F4310 Post-traumatic stress disorder, unspecified: Secondary | ICD-10-CM | POA: Diagnosis not present

## 2022-11-09 DIAGNOSIS — F4310 Post-traumatic stress disorder, unspecified: Secondary | ICD-10-CM | POA: Diagnosis not present

## 2022-11-15 DIAGNOSIS — F4310 Post-traumatic stress disorder, unspecified: Secondary | ICD-10-CM | POA: Diagnosis not present

## 2022-11-22 DIAGNOSIS — F4310 Post-traumatic stress disorder, unspecified: Secondary | ICD-10-CM | POA: Diagnosis not present

## 2022-11-29 DIAGNOSIS — F4310 Post-traumatic stress disorder, unspecified: Secondary | ICD-10-CM | POA: Diagnosis not present

## 2022-12-06 DIAGNOSIS — F4310 Post-traumatic stress disorder, unspecified: Secondary | ICD-10-CM | POA: Diagnosis not present

## 2022-12-13 DIAGNOSIS — F4310 Post-traumatic stress disorder, unspecified: Secondary | ICD-10-CM | POA: Diagnosis not present

## 2022-12-20 DIAGNOSIS — F4310 Post-traumatic stress disorder, unspecified: Secondary | ICD-10-CM | POA: Diagnosis not present

## 2022-12-27 DIAGNOSIS — F4310 Post-traumatic stress disorder, unspecified: Secondary | ICD-10-CM | POA: Diagnosis not present

## 2023-01-03 DIAGNOSIS — F4310 Post-traumatic stress disorder, unspecified: Secondary | ICD-10-CM | POA: Diagnosis not present

## 2023-01-10 DIAGNOSIS — F4310 Post-traumatic stress disorder, unspecified: Secondary | ICD-10-CM | POA: Diagnosis not present

## 2023-01-17 DIAGNOSIS — F4310 Post-traumatic stress disorder, unspecified: Secondary | ICD-10-CM | POA: Diagnosis not present

## 2023-01-24 DIAGNOSIS — F4310 Post-traumatic stress disorder, unspecified: Secondary | ICD-10-CM | POA: Diagnosis not present

## 2023-01-31 DIAGNOSIS — F4310 Post-traumatic stress disorder, unspecified: Secondary | ICD-10-CM | POA: Diagnosis not present

## 2023-02-07 DIAGNOSIS — F4310 Post-traumatic stress disorder, unspecified: Secondary | ICD-10-CM | POA: Diagnosis not present

## 2023-02-17 DIAGNOSIS — F4310 Post-traumatic stress disorder, unspecified: Secondary | ICD-10-CM | POA: Diagnosis not present

## 2023-02-21 DIAGNOSIS — F4310 Post-traumatic stress disorder, unspecified: Secondary | ICD-10-CM | POA: Diagnosis not present

## 2023-02-28 DIAGNOSIS — F4310 Post-traumatic stress disorder, unspecified: Secondary | ICD-10-CM | POA: Diagnosis not present

## 2023-03-06 DIAGNOSIS — F4310 Post-traumatic stress disorder, unspecified: Secondary | ICD-10-CM | POA: Diagnosis not present

## 2023-03-14 DIAGNOSIS — F4310 Post-traumatic stress disorder, unspecified: Secondary | ICD-10-CM | POA: Diagnosis not present

## 2023-03-22 DIAGNOSIS — F4310 Post-traumatic stress disorder, unspecified: Secondary | ICD-10-CM | POA: Diagnosis not present

## 2023-04-07 DIAGNOSIS — F4310 Post-traumatic stress disorder, unspecified: Secondary | ICD-10-CM | POA: Diagnosis not present

## 2023-04-14 DIAGNOSIS — F4310 Post-traumatic stress disorder, unspecified: Secondary | ICD-10-CM | POA: Diagnosis not present

## 2023-04-20 DIAGNOSIS — F4310 Post-traumatic stress disorder, unspecified: Secondary | ICD-10-CM | POA: Diagnosis not present

## 2023-04-26 DIAGNOSIS — F4310 Post-traumatic stress disorder, unspecified: Secondary | ICD-10-CM | POA: Diagnosis not present

## 2023-05-02 DIAGNOSIS — F4310 Post-traumatic stress disorder, unspecified: Secondary | ICD-10-CM | POA: Diagnosis not present

## 2023-05-09 DIAGNOSIS — F4310 Post-traumatic stress disorder, unspecified: Secondary | ICD-10-CM | POA: Diagnosis not present

## 2023-05-17 DIAGNOSIS — F4310 Post-traumatic stress disorder, unspecified: Secondary | ICD-10-CM | POA: Diagnosis not present

## 2023-05-23 DIAGNOSIS — F4310 Post-traumatic stress disorder, unspecified: Secondary | ICD-10-CM | POA: Diagnosis not present

## 2023-05-30 DIAGNOSIS — F4310 Post-traumatic stress disorder, unspecified: Secondary | ICD-10-CM | POA: Diagnosis not present

## 2023-06-06 DIAGNOSIS — F4310 Post-traumatic stress disorder, unspecified: Secondary | ICD-10-CM | POA: Diagnosis not present

## 2023-06-13 DIAGNOSIS — F4310 Post-traumatic stress disorder, unspecified: Secondary | ICD-10-CM | POA: Diagnosis not present

## 2023-06-20 DIAGNOSIS — F4310 Post-traumatic stress disorder, unspecified: Secondary | ICD-10-CM | POA: Diagnosis not present

## 2023-06-29 DIAGNOSIS — F4310 Post-traumatic stress disorder, unspecified: Secondary | ICD-10-CM | POA: Diagnosis not present

## 2023-07-04 DIAGNOSIS — F4310 Post-traumatic stress disorder, unspecified: Secondary | ICD-10-CM | POA: Diagnosis not present

## 2023-07-18 DIAGNOSIS — F4310 Post-traumatic stress disorder, unspecified: Secondary | ICD-10-CM | POA: Diagnosis not present

## 2023-07-25 DIAGNOSIS — F4310 Post-traumatic stress disorder, unspecified: Secondary | ICD-10-CM | POA: Diagnosis not present

## 2023-08-01 ENCOUNTER — Other Ambulatory Visit: Payer: Self-pay | Admitting: Family Medicine

## 2023-08-01 ENCOUNTER — Other Ambulatory Visit (HOSPITAL_COMMUNITY)
Admission: RE | Admit: 2023-08-01 | Discharge: 2023-08-01 | Disposition: A | Discharge status: Home / Self Care | Source: Ambulatory Visit | Attending: Family Medicine | Admitting: Family Medicine

## 2023-08-01 DIAGNOSIS — Z23 Encounter for immunization: Secondary | ICD-10-CM | POA: Diagnosis not present

## 2023-08-01 DIAGNOSIS — F1021 Alcohol dependence, in remission: Secondary | ICD-10-CM | POA: Diagnosis not present

## 2023-08-01 DIAGNOSIS — Z13220 Encounter for screening for lipoid disorders: Secondary | ICD-10-CM | POA: Diagnosis not present

## 2023-08-01 DIAGNOSIS — Z0000 Encounter for general adult medical examination without abnormal findings: Secondary | ICD-10-CM | POA: Diagnosis not present

## 2023-08-01 DIAGNOSIS — Z01411 Encounter for gynecological examination (general) (routine) with abnormal findings: Secondary | ICD-10-CM | POA: Diagnosis not present

## 2023-08-01 DIAGNOSIS — F4310 Post-traumatic stress disorder, unspecified: Secondary | ICD-10-CM | POA: Diagnosis not present

## 2023-08-01 DIAGNOSIS — Z130 Encounter for screening for diseases of the blood and blood-forming organs and certain disorders involving the immune mechanism: Secondary | ICD-10-CM | POA: Diagnosis not present

## 2023-08-03 DIAGNOSIS — Z111 Encounter for screening for respiratory tuberculosis: Secondary | ICD-10-CM | POA: Diagnosis not present

## 2023-08-09 DIAGNOSIS — F4310 Post-traumatic stress disorder, unspecified: Secondary | ICD-10-CM | POA: Diagnosis not present

## 2023-08-09 LAB — CYTOLOGY - PAP
Comment: NEGATIVE
Diagnosis: NEGATIVE
High risk HPV: NEGATIVE

## 2023-08-14 DIAGNOSIS — E2839 Other primary ovarian failure: Secondary | ICD-10-CM | POA: Diagnosis not present

## 2023-08-14 DIAGNOSIS — M8588 Other specified disorders of bone density and structure, other site: Secondary | ICD-10-CM | POA: Diagnosis not present

## 2023-08-14 DIAGNOSIS — N958 Other specified menopausal and perimenopausal disorders: Secondary | ICD-10-CM | POA: Diagnosis not present

## 2023-08-15 DIAGNOSIS — F4310 Post-traumatic stress disorder, unspecified: Secondary | ICD-10-CM | POA: Diagnosis not present

## 2023-08-22 DIAGNOSIS — F4310 Post-traumatic stress disorder, unspecified: Secondary | ICD-10-CM | POA: Diagnosis not present

## 2023-09-14 DIAGNOSIS — F4310 Post-traumatic stress disorder, unspecified: Secondary | ICD-10-CM | POA: Diagnosis not present

## 2023-09-25 DIAGNOSIS — F4310 Post-traumatic stress disorder, unspecified: Secondary | ICD-10-CM | POA: Diagnosis not present

## 2023-10-02 DIAGNOSIS — F4310 Post-traumatic stress disorder, unspecified: Secondary | ICD-10-CM | POA: Diagnosis not present

## 2023-10-09 DIAGNOSIS — F4310 Post-traumatic stress disorder, unspecified: Secondary | ICD-10-CM | POA: Diagnosis not present

## 2023-10-16 DIAGNOSIS — F4310 Post-traumatic stress disorder, unspecified: Secondary | ICD-10-CM | POA: Diagnosis not present

## 2023-10-30 DIAGNOSIS — F4310 Post-traumatic stress disorder, unspecified: Secondary | ICD-10-CM | POA: Diagnosis not present

## 2023-11-06 DIAGNOSIS — F4310 Post-traumatic stress disorder, unspecified: Secondary | ICD-10-CM | POA: Diagnosis not present

## 2023-11-20 DIAGNOSIS — F4310 Post-traumatic stress disorder, unspecified: Secondary | ICD-10-CM | POA: Diagnosis not present

## 2023-11-27 DIAGNOSIS — F4310 Post-traumatic stress disorder, unspecified: Secondary | ICD-10-CM | POA: Diagnosis not present

## 2023-12-04 DIAGNOSIS — F4310 Post-traumatic stress disorder, unspecified: Secondary | ICD-10-CM | POA: Diagnosis not present

## 2023-12-11 DIAGNOSIS — F4310 Post-traumatic stress disorder, unspecified: Secondary | ICD-10-CM | POA: Diagnosis not present

## 2023-12-18 DIAGNOSIS — F4310 Post-traumatic stress disorder, unspecified: Secondary | ICD-10-CM | POA: Diagnosis not present

## 2023-12-25 DIAGNOSIS — F4310 Post-traumatic stress disorder, unspecified: Secondary | ICD-10-CM | POA: Diagnosis not present

## 2024-01-01 DIAGNOSIS — F4310 Post-traumatic stress disorder, unspecified: Secondary | ICD-10-CM | POA: Diagnosis not present

## 2024-01-08 DIAGNOSIS — F4310 Post-traumatic stress disorder, unspecified: Secondary | ICD-10-CM | POA: Diagnosis not present

## 2024-01-22 DIAGNOSIS — F4310 Post-traumatic stress disorder, unspecified: Secondary | ICD-10-CM | POA: Diagnosis not present

## 2024-02-05 DIAGNOSIS — F4310 Post-traumatic stress disorder, unspecified: Secondary | ICD-10-CM | POA: Diagnosis not present

## 2024-02-12 DIAGNOSIS — F4310 Post-traumatic stress disorder, unspecified: Secondary | ICD-10-CM | POA: Diagnosis not present

## 2024-02-19 DIAGNOSIS — F4310 Post-traumatic stress disorder, unspecified: Secondary | ICD-10-CM | POA: Diagnosis not present

## 2024-02-26 DIAGNOSIS — F4310 Post-traumatic stress disorder, unspecified: Secondary | ICD-10-CM | POA: Diagnosis not present

## 2024-03-04 DIAGNOSIS — F4310 Post-traumatic stress disorder, unspecified: Secondary | ICD-10-CM | POA: Diagnosis not present
